# Patient Record
Sex: Male | Born: 1950 | Race: Black or African American | Hispanic: No | Marital: Married | Smoking: Never smoker
Health system: Southern US, Community
[De-identification: ages and names within clinical notes are randomized; demographics above are authoritative.]

---

## 2000-07-22 ENCOUNTER — Ambulatory Visit (HOSPITAL_COMMUNITY): Admission: RE | Admit: 2000-07-22 | Discharge: 2000-07-22 | Payer: Self-pay | Admitting: Gastroenterology

## 2000-07-22 ENCOUNTER — Encounter (INDEPENDENT_AMBULATORY_CARE_PROVIDER_SITE_OTHER): Payer: Self-pay | Admitting: *Deleted

## 2007-05-02 ENCOUNTER — Ambulatory Visit (HOSPITAL_COMMUNITY): Admission: RE | Admit: 2007-05-02 | Discharge: 2007-05-02 | Payer: Self-pay | Admitting: Gastroenterology

## 2010-01-30 ENCOUNTER — Observation Stay (HOSPITAL_COMMUNITY): Admission: EM | Admit: 2010-01-30 | Discharge: 2009-07-02 | Payer: Self-pay | Admitting: Emergency Medicine

## 2010-05-13 LAB — DIFFERENTIAL
Lymphocytes Relative: 19 % (ref 12–46)
Lymphs Abs: 2 10*3/uL (ref 0.7–4.0)
Monocytes Absolute: 0.6 10*3/uL (ref 0.1–1.0)
Neutrophils Relative %: 74 % (ref 43–77)

## 2010-05-13 LAB — URINALYSIS, ROUTINE W REFLEX MICROSCOPIC
Glucose, UA: NEGATIVE mg/dL
Ketones, ur: 15 mg/dL — AB
Protein, ur: NEGATIVE mg/dL
pH: 5.5 (ref 5.0–8.0)

## 2010-05-13 LAB — COMPREHENSIVE METABOLIC PANEL
AST: 25 U/L (ref 0–37)
Alkaline Phosphatase: 68 U/L (ref 39–117)
BUN: 11 mg/dL (ref 6–23)
CO2: 25 mEq/L (ref 19–32)
Calcium: 9.2 mg/dL (ref 8.4–10.5)
Chloride: 105 mEq/L (ref 96–112)
Creatinine, Ser: 1.12 mg/dL (ref 0.4–1.5)
GFR calc Af Amer: >60 mL/min (ref >60)
Sodium: 138 mEq/L (ref 135–145)
Total Protein: 7.7 g/dL (ref 6.0–8.3)

## 2010-05-13 LAB — URINE CULTURE

## 2010-05-13 LAB — CBC
MCV: 90.9 fL (ref 78.0–100.0)
Platelets: 239 10*3/uL (ref 150–400)

## 2010-07-08 NOTE — Op Note (Signed)
NAMEBARRY, Williams                ACCOUNT NO.:  [id number]   MEDICAL RECORD NO.:  [id number]          PATIENT TYPE:  AMB   LOCATION:  ENDO                         FACILITY:  [facility]   PHYSICIAN:  Anselmo Rod, M.D.  DATE OF BIRTH:  [birth year]   DATE OF PROCEDURE:  05/02/2007  DATE OF DISCHARGE:                               OPERATIVE REPORT   PROCEDURE PERFORMED:  Screening colonoscopy.   ENDOSCOPIST:  Anselmo Rod, M.D.   INSTRUMENT USED:  Pentax video colonoscope.   INDICATIONS FOR PROCEDURE:  A 60 year old [place] American male  undergoing a screening colonoscopy. The patient has a family history of  colon cancer in his mother who was diagnosed in her 2s and died in her  59s.   PREPROCEDURE PREPARATION:  Informed consent was procured from the  patient. The patient fasted for 4 hours prior to the procedure and  prepped with 20 Osmoprep pills on the night of and 12 Osmoprep pills the  morning of the procedure.  The risks and benefits of the procedure  including a 10% miss rate of cancer and polyp were discussed with the  patient as well.   PREPROCEDURE PHYSICAL:  The patient had stable vital signs.  NECK:  Supple. Chest clear to auscultation. S1, S2 regular.  Abdomen soft with  normal bowel sounds.   DESCRIPTION OF PROCEDURE:  The patient was placed in left lateral  decubitus position and sedated with 75 mcg of Fentanyl and 5 mg of  Versed given intravenously in slow incremental doses. Once the patient  was adequately sedated and maintained on low-flow oxygen and continuous  cardiac monitoring. The Pentax video colonoscope was advanced from the  rectum to the cecum.  There was evidence of melanosis coli throughout  the colonic mucosa with more prominent changes in the sigmoid colon and  in the transverse colon.  The appendiceal orifice and ileocecal valve  were visualized and photographed.  The terminal ileum was not  visualized.  There was evidence of sigmoid  diverticulosis.  Retroflexion  revealed no abnormalities.   IMPRESSION:  1. Sigmoid diverticulosis.  2. 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  3. No masses or polyps seen.   RECOMMENDATIONS:  1. Continue surveillance.  2. A high-fiber diet with liberal fluid intake has been advocated.  3. A repeat colonoscopy has been advised in the next five years. If      the patient has any abnormal symptoms in the interim he is to      contact the office immediately for further recommendations.  4. Outpatient follow-up as need arises in the future.      Anselmo Rod, M.D.  Electronically Signed     JNM/MEDQ  D:  05/02/2007  T:  05/02/2007  Job:  [id number]   cc:   Candyce Churn. Allyne Gee, M.D.  Fax: [phone]

## 2010-07-11 NOTE — Procedures (Signed)
[facility]  Patient:    Gene Williams, Gene Williams                       MRN: [id number] Proc. Date: 07/22/00 Adm. Date:  [id number] Attending:  Charna Elizabeth CC:         Kern Reap, M.D.   Procedure Report  DATE OF BIRTH:  [birth year].  PROCEDURE:  Colonoscopy with cold biopsies.  ENDOSCOPIST:  Anselmo Rod, M.D.  INSTRUMENT USED:  Olympus video colonoscope.  INDICATION FOR PROCEDURE:  A 60 year old African-American male with a history of colon cancer in the family.  Rule out colonic polyps.  PREPROCEDURE PREPARATION:  Informed consent was procured from the patient. The patient was fasted for eight hours prior to the procedure and prepped with a bottle of magnesium citrate and a gallon of NuLytely the night prior to the procedure.  PREPROCEDURE PHYSICAL:  VITAL SIGNS:  The patient had stable vital signs.  NECK:  Supple.  CHEST:  Clear to auscultation.  S1, S2 regular.  ABDOMEN:  Soft with normal bowel sounds.  No hepatosplenomegaly.  No masses palpable.  DESCRIPTION OF PROCEDURE:  The patient was placed in the left lateral decubitus position and sedated with 50 mg of Demerol and 7.5 mg of Versed intravenously.  Once the patient was adequately sedate and maintained on low-flow oxygen and continuous cardiac monitoring, the Olympus video colonoscope was advanced from the rectum to the cecum without difficulty. Except for a few diminutive polyps that were removed by cold biopsy forceps from about 30 cm, there were early left-sided diverticula in different stages of formation.  The procedure was complete up to the cecum.  The ileocecal valve and the appendiceal orifice were clearly visualized.  The rest of the colonic mucosa appeared healthy and without lesions.  The patient tolerated the procedure well without complications.  IMPRESSION: 1. Early left-sided diverticulosis. 2. Four diminutive polyps removed by cold biopsy forceps from  30 cm. 3. Otherwise healthy-appearing cecum, right colon, and transverse colon.  RECOMMENDATIONS: 1. High-fiber diet. 2. Await pathology results. 3. Repeat colorectal cancer screening in the next three to five years    depending on the pathology results. 4. Outpatient follow-up on a p.r.n. basis. DD:  07/22/00 TD:  07/22/00 Job: [id number]

## 2011-08-28 IMAGING — CR DG LUMBAR SPINE COMPLETE 4+V
5 series · 5 of 5 positions shown · non-contrast
Comparison: None

CLINICAL DATA: Syncope.  Fell.  Back pain.

LUMBAR SPINE - COMPLETE 4+ VIEW

[t l-spine a.p.]
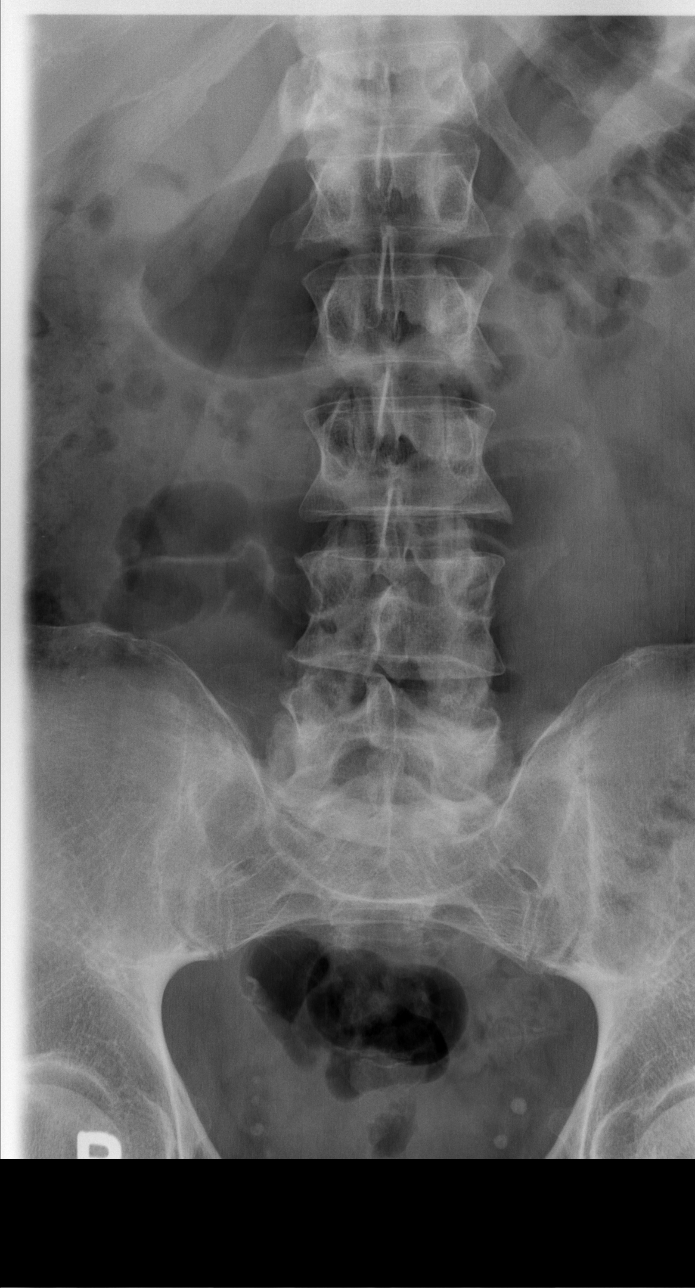

[t l-spine oblique exposure (1 of 2)]
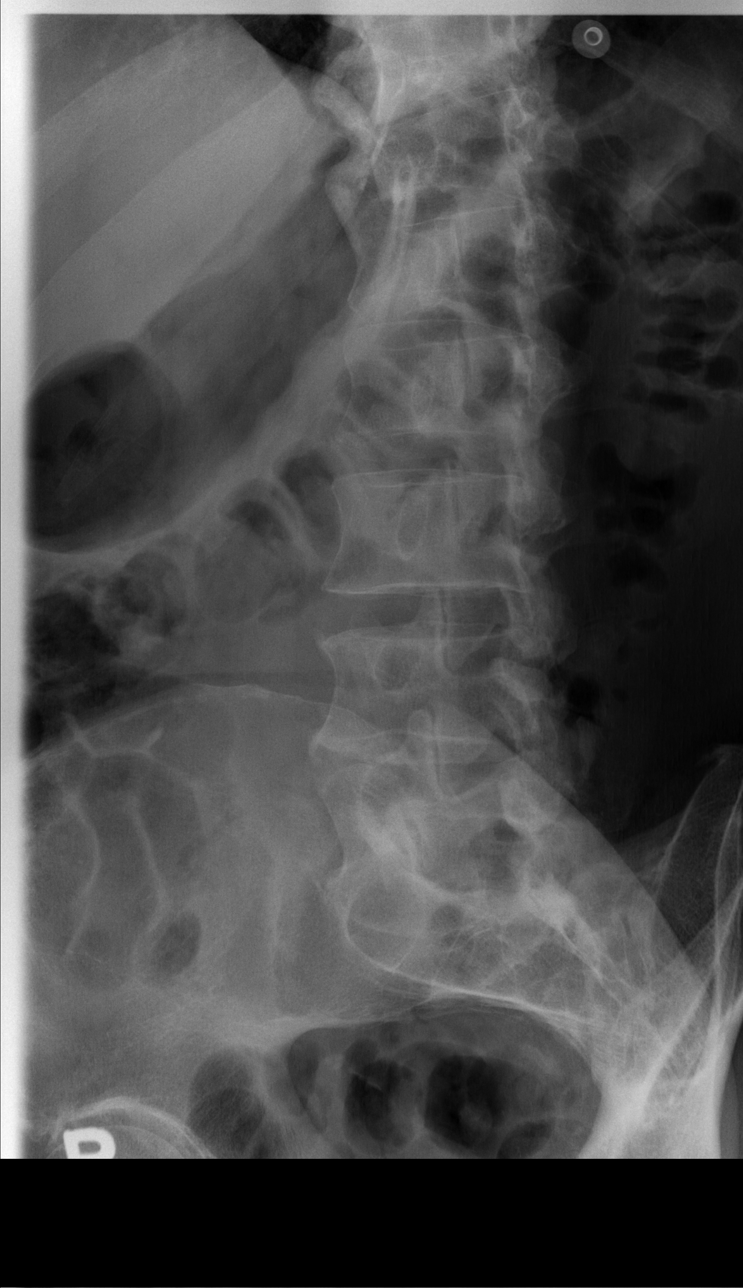

[t l-spine oblique exposure (2 of 2)]
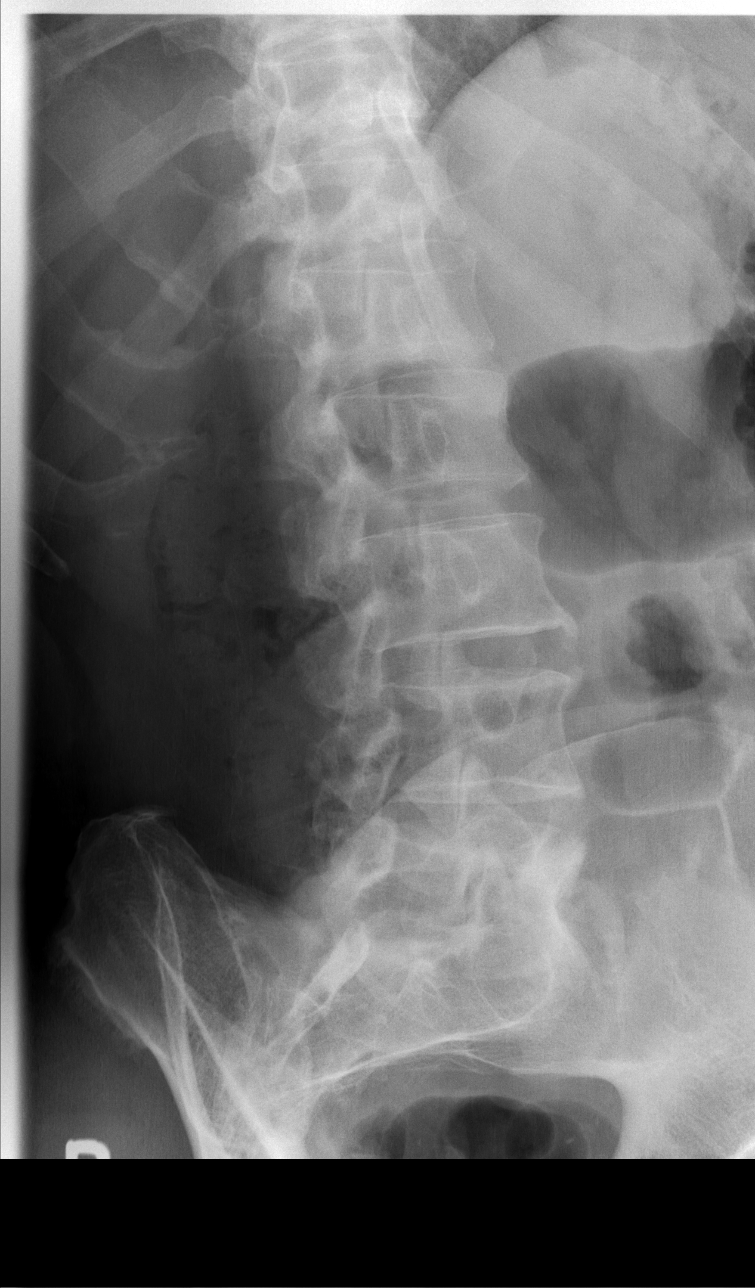

[t l-spine lat]
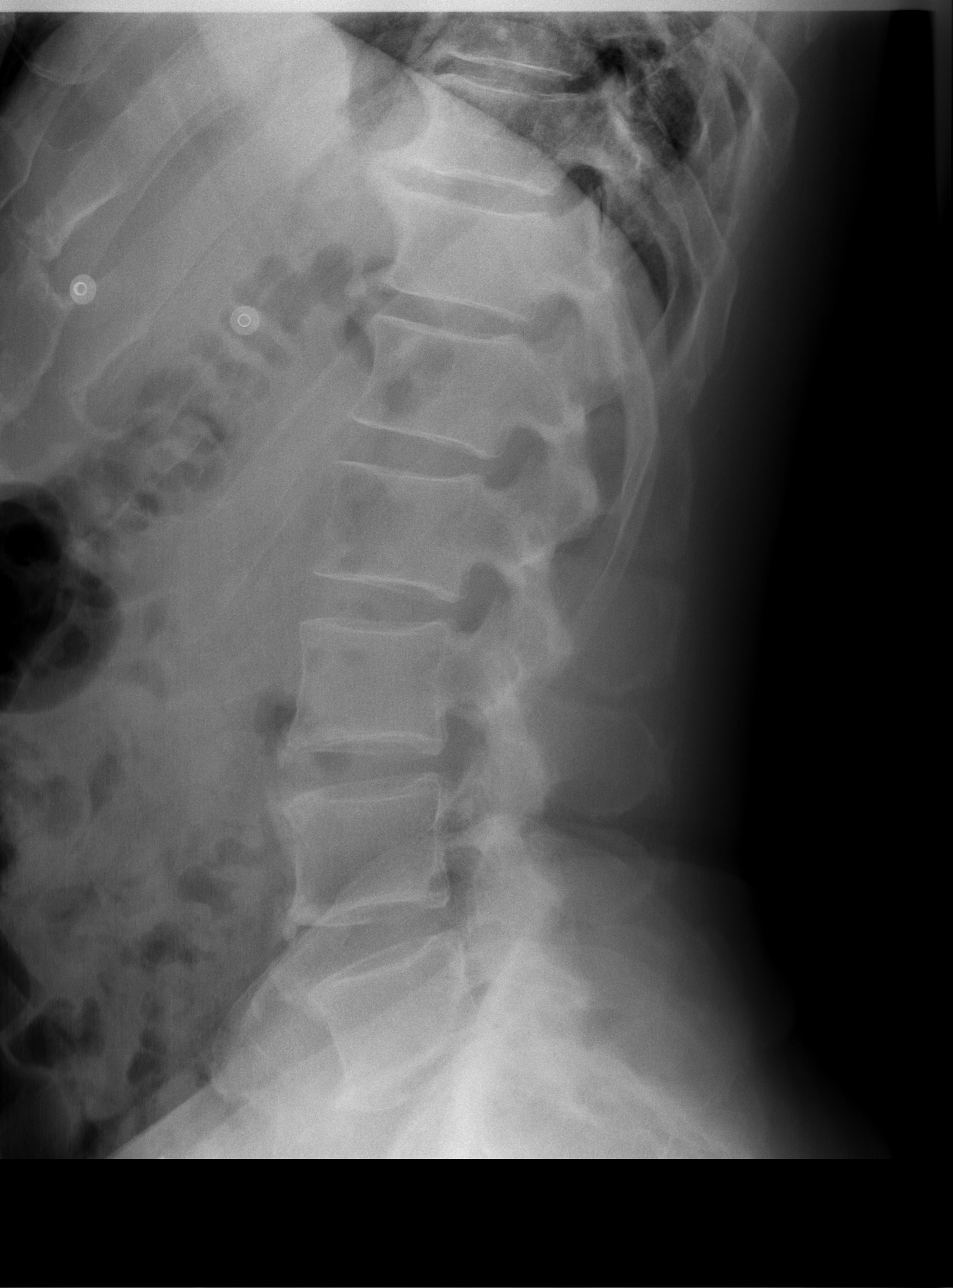

[t l-spine l5-s1 spot]
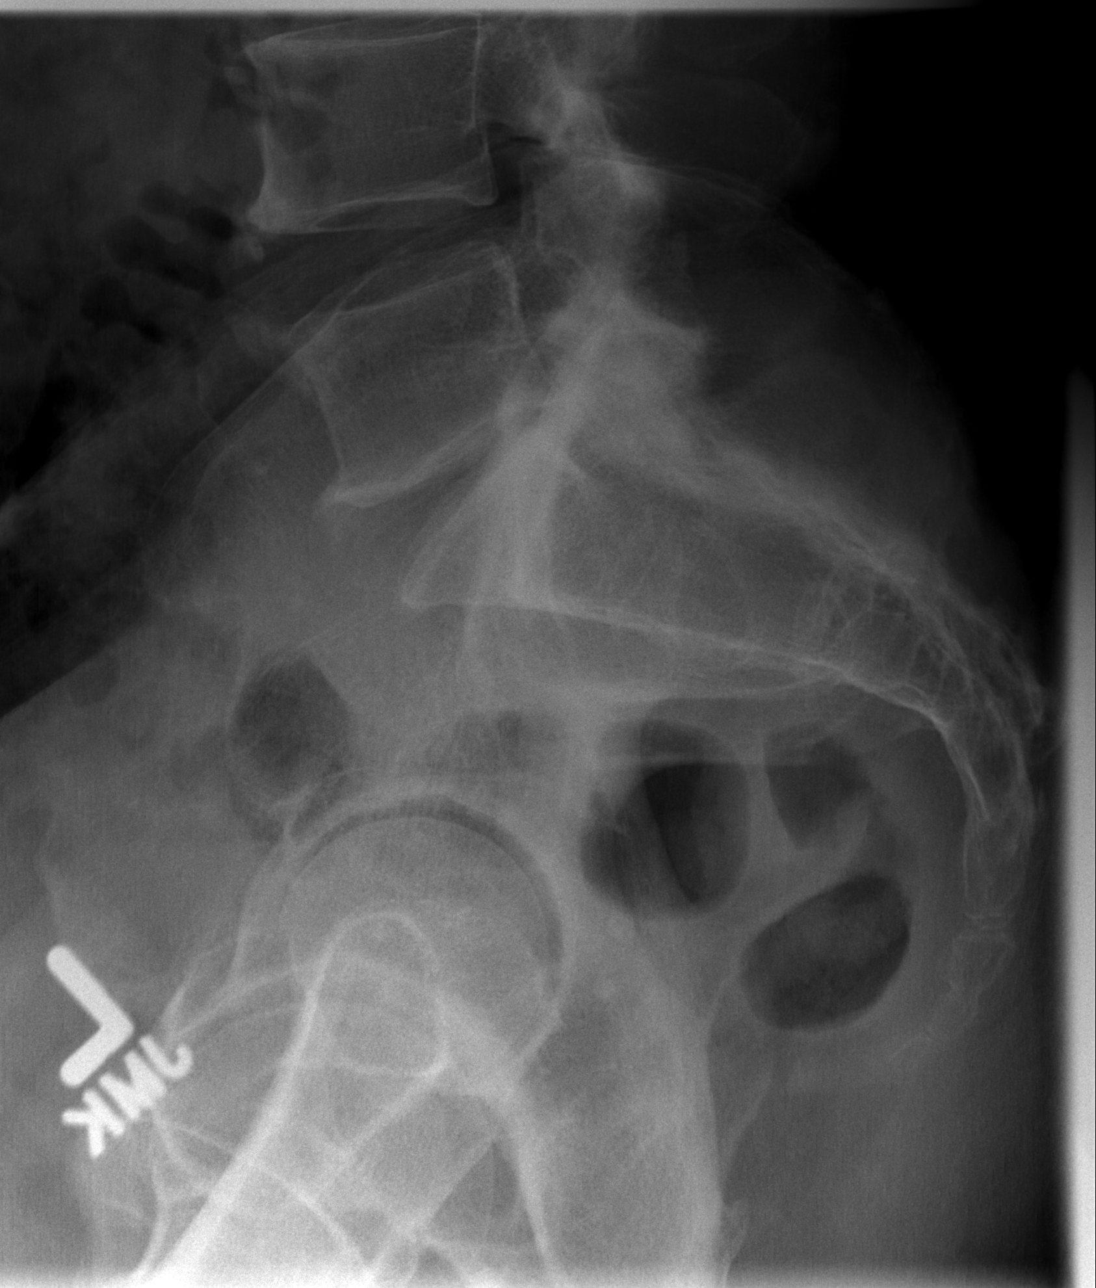

[5 of 5 positions shown; findings below may reference images not displayed]

FINDINGS: Mild degenerative changes in the lumbar spine.  No acute
bony findings.  No pars defects.  The visualized bony pelvis is
intact.  The SI joints are at least partially fused.
IMPRESSION: Normal alignment and no acute bony findings.

## 2019-10-10 ENCOUNTER — Other Ambulatory Visit: Payer: Self-pay

## 2019-10-10 DIAGNOSIS — Z20822 Contact with and (suspected) exposure to covid-19: Secondary | ICD-10-CM

## 2019-10-11 LAB — SARS-COV-2, NAA 2 DAY TAT

## 2019-10-11 LAB — NOVEL CORONAVIRUS, NAA: SARS-CoV-2, NAA: NOT DETECTED

## 2020-10-05 ENCOUNTER — Encounter: Payer: Self-pay | Admitting: Emergency Medicine

## 2020-10-05 ENCOUNTER — Ambulatory Visit
Admission: EM | Admit: 2020-10-05 | Discharge: 2020-10-05 | Disposition: A | Discharge status: Home / Self Care | Payer: Medicare Other | Attending: Emergency Medicine | Admitting: Emergency Medicine

## 2020-10-05 DIAGNOSIS — J029 Acute pharyngitis, unspecified: Secondary | ICD-10-CM

## 2020-10-05 DIAGNOSIS — J069 Acute upper respiratory infection, unspecified: Secondary | ICD-10-CM | POA: Diagnosis not present

## 2020-10-05 MED ORDER — PREDNISONE 20 MG PO TABS: 20.0000 mg | ORAL_TABLET | Freq: Two times a day (BID) | ORAL | 0 refills | Status: AC

## 2020-10-05 NOTE — ED Provider Notes (Signed)
[facility]   [id number] 10/05/20 Arrival Time: 1141   CC: COVID symptoms  SUBJECTIVE: History from: patient.  Gene Williams is a 70 y.o. male who presents with sore throat, runny nose, cough, and body aches x 3 days  Denies sick exposure to COVID, flu or strep.  Has tried OTC medications without relief.  Symptoms are made worse with swallowing, but tolerating secretions.  Reports previous symptoms in the past with a cold.   Denies fever, chills, SOB, wheezing, chest pain, nausea, changes in bowel or bladder habits.     ROS: As per HPI.  All other pertinent ROS negative.     History reviewed. No pertinent past medical history. History reviewed. No pertinent surgical history. No Known Allergies No current facility-administered medications on file prior to encounter.   No current outpatient medications on file prior to encounter.   Social History   Socioeconomic History   Marital status: Married    Spouse name: Not on file   Number of children: Not on file   Years of education: Not on file   Highest education level: Not on file  Occupational History   Not on file  Tobacco Use   Smoking status: Never   Smokeless tobacco: Never  Substance and Sexual Activity   Alcohol use: Never   Drug use: Never   Sexual activity: Not on file  Other Topics Concern   Not on file  Social History Narrative   Not on file   Social Determinants of Health   Financial Resource Strain: Not on file  Food Insecurity: Not on file  Transportation Needs: Not on file  Physical Activity: Not on file  Stress: Not on file  Social Connections: Not on file  Intimate Partner Violence: Not on file   No family history on file.  OBJECTIVE:  Vitals:   10/05/20 1227  BP: 123/74  Pulse: 76  Resp: 19  Temp: 99 F (37.2 C)  TempSrc: Oral  SpO2: 95%    General appearance: alert; fatigued appearing, nontoxic; speaking in full sentences and tolerating own secretions HEENT: NCAT; Ears:  EACs clear, TMs pearly gray; Eyes: PERRL.  EOM grossly intact.Nose: nares patent without rhinorrhea, Throat: oropharynx clear, tonsils non erythematous or enlarged, uvula midline  Neck: supple without LAD Lungs: unlabored respirations, symmetrical air entry; cough: absent; no respiratory distress; CTAB Heart: regular rate and rhythm.  Skin: warm and dry Psychological: alert and cooperative; normal mood and affect   ASSESSMENT & PLAN:  1. Viral URI with cough   2. Sore throat     Meds ordered this encounter  Medications   predniSONE (DELTASONE) 20 MG tablet    Sig: Take 1 tablet (20 mg total) by mouth 2 (two) times daily with a meal for 5 days.    Dispense:  10 tablet    Refill:  0    Order Specific Question:   Supervising Provider    Answer:   Eustace Moore [[id number]]     Declines covid test Get plenty of rest and push fluids Prednisone for congestion and sore throat Use OTC zyrtec for nasal congestion, runny nose, and/or sore throat Use OTC flonase for nasal congestion and runny nose Use medications daily for symptom relief Use OTC medications like ibuprofen or tylenol as needed fever or pain Call or go to the ED if you have any new or worsening symptoms such as fever, worsening cough, shortness of breath, chest tightness, chest pain, turning blue, changes in mental status,  etc...   Reviewed expectations re: course of current medical issues. Questions answered. Outlined signs and symptoms indicating need for more acute intervention. Patient verbalized understanding. After Visit Summary given.          Rennis Harding, PA-C 10/05/20 1252

## 2020-10-05 NOTE — ED Triage Notes (Signed)
Sore throat, runny nose, cough and body aches x 3 days.

## 2020-10-05 NOTE — Discharge Instructions (Addendum)
Declines covid test Get plenty of rest and push fluids Prednisone for congestion and sore throat Use OTC zyrtec for nasal congestion, runny nose, and/or sore throat Use OTC flonase for nasal congestion and runny nose Use medications daily for symptom relief Use OTC medications like ibuprofen or tylenol as needed fever or pain Call or go to the ED if you have any new or worsening symptoms such as fever, worsening cough, shortness of breath, chest tightness, chest pain, turning blue, changes in mental status, etc..Marland Kitchen

## 2023-12-15 ENCOUNTER — Other Ambulatory Visit: Payer: Self-pay

## 2023-12-15 ENCOUNTER — Emergency Department (HOSPITAL_BASED_OUTPATIENT_CLINIC_OR_DEPARTMENT_OTHER)
Admission: EM | Admit: 2023-12-15 | Discharge: 2023-12-15 | Disposition: A | Discharge status: Home / Self Care | Attending: Emergency Medicine | Admitting: Emergency Medicine

## 2023-12-15 ENCOUNTER — Encounter (HOSPITAL_BASED_OUTPATIENT_CLINIC_OR_DEPARTMENT_OTHER): Payer: Self-pay

## 2023-12-15 ENCOUNTER — Emergency Department (HOSPITAL_BASED_OUTPATIENT_CLINIC_OR_DEPARTMENT_OTHER)

## 2023-12-15 DIAGNOSIS — M5410 Radiculopathy, site unspecified: Secondary | ICD-10-CM

## 2023-12-15 DIAGNOSIS — M5416 Radiculopathy, lumbar region: Secondary | ICD-10-CM | POA: Insufficient documentation

## 2023-12-15 DIAGNOSIS — M25552 Pain in left hip: Secondary | ICD-10-CM | POA: Diagnosis present

## 2023-12-15 MED ORDER — METHOCARBAMOL 500 MG PO TABS: 500.0000 mg | ORAL_TABLET | Freq: Two times a day (BID) | ORAL | 0 refills | Status: AC

## 2023-12-15 MED ORDER — KETOROLAC TROMETHAMINE 15 MG/ML IJ SOLN
15.0000 mg | Freq: Once | INTRAMUSCULAR | Status: AC
  Administered 2023-12-15: 15 mg via INTRAMUSCULAR
  Filled 2023-12-15: qty 1

## 2023-12-15 MED ORDER — DICLOFENAC SODIUM 1 % EX GEL: 4.0000 g | Freq: Four times a day (QID) | CUTANEOUS | 0 refills | Status: AC

## 2023-12-15 MED ORDER — DIAZEPAM 5 MG PO TABS
5.0000 mg | ORAL_TABLET | Freq: Once | ORAL | Status: AC
  Administered 2023-12-15: 5 mg via ORAL
  Filled 2023-12-15: qty 1

## 2023-12-15 MED ORDER — OXYCODONE HCL 5 MG PO TABS
5.0000 mg | ORAL_TABLET | Freq: Once | ORAL | Status: AC
  Administered 2023-12-15: 5 mg via ORAL
  Filled 2023-12-15: qty 1

## 2023-12-15 MED ORDER — METHYLPREDNISOLONE 4 MG PO TBPK: ORAL_TABLET | ORAL | 0 refills | Status: AC

## 2023-12-15 MED ORDER — ACETAMINOPHEN 500 MG PO TABS
1000.0000 mg | ORAL_TABLET | Freq: Once | ORAL | Status: AC
  Administered 2023-12-15: 1000 mg via ORAL
  Filled 2023-12-15: qty 2

## 2023-12-15 NOTE — Discharge Instructions (Signed)
Your back pain is most likely due to a muscular strain.  There is been a lot of research on back pain, unfortunately the only thing that seems to really help is Tylenol and ibuprofen.  Relative rest is also important to not lift greater than 10 pounds bending or twisting at the waist.  Please follow-up with your family physician.  The other thing that really seems to benefit patients is physical therapy which your doctor may send you for.  Please return to the emergency department for new numbness or weakness to your arms or legs. Difficulty with urinating or urinating or pooping on yourself.  Also if you cannot feel toilet paper when you wipe or get a fever.   Use the gel as prescribed.  Take the steroids as prescribed Also take tylenol 1000mg (2 extra strength) four times a day.

## 2023-12-15 NOTE — ED Provider Notes (Signed)
 [place] EMERGENCY DEPARTMENT AT [facility] Provider Note   CSN: [id number] Arrival date & time: 12/15/23  2134     Patient presents with: Hip Pain   Gene Williams is a 73 y.o. male.   73 yo M with a chief complaints of left lower back pain that radiates down the leg.  Has been going on for about a day and a half he said.  He works for what sounds at a [employer], sport and exercise.  [place] he was doing something and had felt a twinge in his back.  Had come back home and started putting in hardwood floors and realized that his back was getting much worse.  He denies trauma denies loss of bowel or bladder denies loss of sensation denies numbness or weakness to the leg.  He has had something happen to him similar he thinks maybe 5 years ago.  He tells me that he really needs to be able to get up and move around and has it important funeral tomorrow that he needs to be able to prepare for.   Hip Pain       Prior to Admission medications   Medication Sig Start Date End Date Taking? Authorizing Provider  diclofenac Sodium (VOLTAREN) 1 % GEL Apply 4 g topically 4 (four) times daily. 12/15/23  Yes Emil Share, DO  methocarbamol (ROBAXIN) 500 MG tablet Take 1 tablet (500 mg total) by mouth 2 (two) times daily. 12/15/23  Yes Jezelle Gullick, DO  methylPREDNISolone (MEDROL DOSEPAK) 4 MG TBPK tablet Day 1: 8mg  before breakfast, 4 mg after lunch, 4 mg after supper, and 8 mg at bedtime Day 2: 4 mg before breakfast, 4 mg after lunch, 4 mg  after supper, and 8 mg  at bedtime Day 3:  4 mg  before breakfast, 4 mg  after lunch, 4 mg after supper, and 4 mg  at bedtime Day 4: 4 mg  before breakfast, 4 mg  after lunch, and 4 mg at bedtime Day 5: 4 mg  before breakfast and 4 mg at bedtime Day 6: 4 mg  before breakfast 12/15/23  Yes Kendallyn Lippold, DO    Allergies: Patient has no known allergies.    Review of Systems  Updated Vital Signs BP (!) 162/92   Pulse 68   Temp 98.1 F (36.7 C) (Temporal)   Resp 18    Ht 6' 1 (1.854 m)   Wt 99.8 kg   SpO2 100%   BMI 29.03 kg/m   Physical Exam Vitals and nursing note reviewed.  Constitutional:      Appearance: He is well-developed.  HENT:     Head: Normocephalic and atraumatic.  Eyes:     Pupils: Pupils are equal, round, and reactive to light.  Neck:     Vascular: No JVD.  Cardiovascular:     Rate and Rhythm: Normal rate and regular rhythm.     Heart sounds: No murmur heard.    No friction rub. No gallop.  Pulmonary:     Effort: No respiratory distress.     Breath sounds: No wheezing.  Abdominal:     General: There is no distension.     Tenderness: There is no abdominal tenderness. There is no guarding or rebound.  Musculoskeletal:        General: Normal range of motion.     Cervical back: Normal range of motion and neck supple.     Comments: Pulse motor and sensation intact to left lower extremity.  Reflexes are  2+ and equal.  No clonus.  Skin:    Coloration: Skin is not pale.     Findings: No rash.  Neurological:     Mental Status: He is alert and oriented to person, place, and time.  Psychiatric:        Behavior: Behavior normal.     (all labs ordered are listed, but only abnormal results are displayed) Labs Reviewed - No data to display  EKG: None  Radiology: CT L-SPINE NO CHARGE Result Date: 12/15/2023 CLINICAL DATA:  Left hip pain, sciatica EXAM: CT LUMBAR SPINE WITHOUT CONTRAST TECHNIQUE: Multidetector CT imaging of the lumbar spine was performed without intravenous contrast administration. Multiplanar CT image reconstructions were also generated. RADIATION DOSE REDUCTION: This exam was performed according to the departmental dose-optimization program which includes automated exposure control, adjustment of the mA and/or kV according to patient size and/or use of iterative reconstruction technique. COMPARISON:  None Available. FINDINGS: Segmentation: 5 lumbar type vertebrae. Alignment: Alignment is anatomic. Vertebrae: No  acute fracture or focal pathologic process. Paraspinal and other soft tissues: Negative. Disc levels: Findings at individual levels are as follows: L1/L2, L2/L3: Bilateral facet hypertrophy without compressive sequela. L3/L4: Mild circumferential disc bulge with bilateral facet and ligamentum flavum hypertrophy. Mild trefoil central canal stenosis. L4/L5: Circumferential disc bulge with bilateral facet and ligamentum flavum hypertrophy results in moderate central canal stenosis. L5/S1: Circumferential disc bulge with bilateral facet hypertrophy results in moderate symmetrical bilateral neural foraminal encroachment. Reconstructed images demonstrate no additional findings. IMPRESSION: 1. No acute lumbar spine fracture. 2. Multilevel lumbar spondylosis and facet hypertrophy, greatest at the L4-5 and L5-S1 levels as above. Electronically Signed   By: Ozell Daring M.D.   On: 12/15/2023 22:41   CT Renal Stone Study Result Date: 12/15/2023 CLINICAL DATA:  Left hip pain, sciatica, flank pain EXAM: CT ABDOMEN AND PELVIS WITHOUT CONTRAST TECHNIQUE: Multidetector CT imaging of the abdomen and pelvis was performed following the standard protocol without IV contrast. RADIATION DOSE REDUCTION: This exam was performed according to the departmental dose-optimization program which includes automated exposure control, adjustment of the mA and/or kV according to patient size and/or use of iterative reconstruction technique. COMPARISON:  None Available. FINDINGS: Lower chest: No acute pleural or parenchymal lung disease. Hepatobiliary: Unremarkable unenhanced appearance of the liver and gallbladder. Pancreas: Unremarkable unenhanced appearance. Spleen: Unremarkable unenhanced appearance. Adrenals/Urinary Tract: Simple left renal cortical and peripelvic cysts do not require specific imaging follow-up. No urinary tract calculi or obstructive uropathy within either kidney. The adrenals and bladder are unremarkable. Stomach/Bowel:  No bowel obstruction or ileus. Normal appendix right lower quadrant. Diffuse colonic diverticulosis without evidence of acute diverticulitis. No bowel wall thickening or inflammatory change. Vascular/Lymphatic: Aortic atherosclerosis. No enlarged abdominal or pelvic lymph nodes. Reproductive: Mild enlargement of the prostate. Other: No free fluid or free intraperitoneal gas. No abdominal wall hernia. Musculoskeletal: No acute or destructive bony abnormalities. Moderate symmetrical bilateral hip osteoarthritis. Heterotopic ossification within the iliopsoas tendons, right greater than left. Multilevel lumbar spondylosis and facet hypertrophy, greatest at the L5-S1 level. Reconstructed images demonstrate no additional findings. IMPRESSION: 1. Colonic diverticulosis without diverticulitis. 2. Mild enlargement of the prostate. 3. Degenerative changes of the lumbar spine and bilateral hips. No acute fractures. 4.  Aortic Atherosclerosis (ICD10-I70.0). Electronically Signed   By: Ozell Daring M.D.   On: 12/15/2023 22:38     Procedures   Medications Ordered in the ED  acetaminophen (TYLENOL) tablet 1,000 mg (1,000 mg Oral Given 12/15/23 2226)  ketorolac (TORADOL) 15 MG/ML  injection 15 mg (15 mg Intramuscular Given 12/15/23 2226)  oxyCODONE (Oxy IR/ROXICODONE) immediate release tablet 5 mg (5 mg Oral Given 12/15/23 2226)  diazepam (VALIUM) tablet 5 mg (5 mg Oral Given 12/15/23 2226)                                    Medical Decision Making Amount and/or Complexity of Data Reviewed Radiology: ordered.  Risk OTC drugs. Prescription drug management.   73 yo M with a chief complaint of left-sided low back pain that radiates down the leg.  This has been going on for about a day and a half.  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  Other  than age no obvious red flags.  Will obtain CT imaging.  Treat symptoms.  CT imaging without obvious acute intra-abdominal or intraspinal pathology.  I discussed results with patient and family.  He is feeling a bit better.  Will discharge home.  PCP follow-up.  10:47 PM:  I have discussed the diagnosis/risks/treatment options with the patient and family.  Evaluation and diagnostic testing in the emergency department does not suggest an emergent condition requiring admission or immediate intervention beyond what has been performed at this time.  They will follow up with PCP. We also discussed returning to the ED immediately if new or worsening sx occur. We discussed the sx which are most concerning (e.g., sudden worsening pain, fever, inability to tolerate by mouth, cauda equina s/sx) that necessitate immediate return. Medications administered to the patient during their visit and any new prescriptions provided to the patient are listed below.  Medications given during this visit Medications  acetaminophen (TYLENOL) tablet 1,000 mg (1,000 mg Oral Given 12/15/23 2226)  ketorolac (TORADOL) 15 MG/ML injection 15 mg (15 mg Intramuscular Given 12/15/23 2226)  oxyCODONE (Oxy IR/ROXICODONE) immediate release tablet 5 mg (5 mg Oral Given 12/15/23 2226)  diazepam (VALIUM) tablet 5 mg (5 mg Oral Given 12/15/23 2226)     The patient appears reasonably screen and/or stabilized for discharge and I doubt any other medical condition or other [facility] requiring further screening, evaluation, or treatment in the ED at this time prior to discharge.       Final diagnoses:  Radicular low back pain    ED Discharge Orders          Ordered    methylPREDNISolone (MEDROL DOSEPAK) 4 MG TBPK tablet        12/15/23 2215    methocarbamol (ROBAXIN) 500 MG tablet  2 times daily        12/15/23 2215    diclofenac Sodium (VOLTAREN) 1 % GEL  4 times daily        12/15/23 2215               Emil Share,  DO 12/15/23 2247

## 2023-12-15 NOTE — ED Triage Notes (Signed)
 Pt reports L sided hip/sciatica pain. Pt reports recently aggravating L hip after doing some flooring. Pt denies any recent injury.
